# Patient Record
Sex: Male | Born: 1990 | Race: Black or African American | Hispanic: No | Marital: Single | Smoking: Current some day smoker
Health system: Southern US, Community
[De-identification: ages and names within clinical notes are randomized; demographics above are authoritative.]

## PROBLEM LIST (undated history)

## (undated) DIAGNOSIS — Z789 Other specified health status: Secondary | ICD-10-CM

## (undated) HISTORY — PX: NO PAST SURGERIES: SHX2092

---

## 2016-07-01 ENCOUNTER — Emergency Department (HOSPITAL_COMMUNITY): Payer: Medicaid Other

## 2016-07-01 ENCOUNTER — Observation Stay (HOSPITAL_COMMUNITY): Payer: Medicaid Other

## 2016-07-01 ENCOUNTER — Observation Stay (HOSPITAL_COMMUNITY)
Admission: EM | Admit: 2016-07-01 | Discharge: 2016-07-02 | Disposition: A | Discharge status: Home / Self Care | Payer: Medicaid Other | Attending: Surgery | Admitting: Surgery

## 2016-07-01 ENCOUNTER — Encounter (HOSPITAL_COMMUNITY): Payer: Self-pay | Admitting: *Deleted

## 2016-07-01 DIAGNOSIS — Z23 Encounter for immunization: Secondary | ICD-10-CM | POA: Diagnosis not present

## 2016-07-01 DIAGNOSIS — S42022B Displaced fracture of shaft of left clavicle, initial encounter for open fracture: Principal | ICD-10-CM | POA: Insufficient documentation

## 2016-07-01 DIAGNOSIS — S41032A Puncture wound without foreign body of left shoulder, initial encounter: Secondary | ICD-10-CM | POA: Diagnosis present

## 2016-07-01 DIAGNOSIS — S42002B Fracture of unspecified part of left clavicle, initial encounter for open fracture: Secondary | ICD-10-CM | POA: Diagnosis present

## 2016-07-01 DIAGNOSIS — T148XXA Other injury of unspecified body region, initial encounter: Secondary | ICD-10-CM

## 2016-07-01 DIAGNOSIS — Z1810 Retained metal fragments, unspecified: Secondary | ICD-10-CM | POA: Diagnosis not present

## 2016-07-01 DIAGNOSIS — W3400XA Accidental discharge from unspecified firearms or gun, initial encounter: Secondary | ICD-10-CM

## 2016-07-01 DIAGNOSIS — S41009A Unspecified open wound of unspecified shoulder, initial encounter: Secondary | ICD-10-CM | POA: Diagnosis present

## 2016-07-01 HISTORY — DX: Other specified health status: Z78.9

## 2016-07-01 LAB — CBC
HEMATOCRIT: 39 % (ref 39.0–52.0)
Hemoglobin: 13.3 g/dL (ref 13.0–17.0)
MCH: 29.2 pg (ref 26.0–34.0)
MCHC: 34.1 g/dL (ref 30.0–36.0)
MCV: 85.5 fL (ref 78.0–100.0)
Platelets: 207 10*3/uL (ref 150–400)
RBC: 4.56 MIL/uL (ref 4.22–5.81)
RDW: 13.2 % (ref 11.5–15.5)
WBC: 13 10*3/uL — AB (ref 4.0–10.5)

## 2016-07-01 LAB — BASIC METABOLIC PANEL
ANION GAP: 12 (ref 5–15)
BUN: 13 mg/dL (ref 6–20)
CALCIUM: 9.1 mg/dL (ref 8.9–10.3)
CO2: 20 mmol/L — AB (ref 22–32)
Chloride: 107 mmol/L (ref 101–111)
Creatinine, Ser: 1.07 mg/dL (ref 0.61–1.24)
GFR calc Af Amer: >60 mL/min (ref >60)
GFR calc non Af Amer: >60 mL/min (ref >60)
GLUCOSE: 117 mg/dL — AB (ref 65–99)
Potassium: 3.2 mmol/L — ABNORMAL LOW (ref 3.5–5.1)
Sodium: 139 mmol/L (ref 135–145)

## 2016-07-01 LAB — PROTIME-INR
INR: 0.94
Prothrombin Time: 12.5 seconds (ref 11.4–15.2)

## 2016-07-01 MED ORDER — ONDANSETRON HCL 4 MG/2ML IJ SOLN
4.0000 mg | Freq: Once | INTRAMUSCULAR | Status: AC
  Administered 2016-07-01: 4 mg via INTRAVENOUS
  Filled 2016-07-01: qty 2

## 2016-07-01 MED ORDER — DOCUSATE SODIUM 100 MG PO CAPS
100.0000 mg | ORAL_CAPSULE | Freq: Two times a day (BID) | ORAL | Status: DC
  Administered 2016-07-01 – 2016-07-02 (×2): 100 mg via ORAL
  Filled 2016-07-01 (×3): qty 1

## 2016-07-01 MED ORDER — MORPHINE SULFATE (PF) 4 MG/ML IV SOLN
4.0000 mg | Freq: Once | INTRAVENOUS | Status: AC
  Administered 2016-07-01: 4 mg via INTRAVENOUS
  Filled 2016-07-01: qty 1

## 2016-07-01 MED ORDER — KCL IN DEXTROSE-NACL 20-5-0.45 MEQ/L-%-% IV SOLN
INTRAVENOUS | Status: DC
  Administered 2016-07-01: 09:00:00 via INTRAVENOUS
  Administered 2016-07-01: 1 mL via INTRAVENOUS
  Filled 2016-07-01 (×2): qty 1000

## 2016-07-01 MED ORDER — IOPAMIDOL (ISOVUE-370) INJECTION 76%
100.0000 mL | Freq: Once | INTRAVENOUS | Status: AC | PRN
  Administered 2016-07-01: 100 mL via INTRAVENOUS

## 2016-07-01 MED ORDER — CEPHALEXIN 500 MG PO CAPS
500.0000 mg | ORAL_CAPSULE | Freq: Four times a day (QID) | ORAL | Status: DC
  Administered 2016-07-01 – 2016-07-02 (×5): 500 mg via ORAL
  Filled 2016-07-01 (×5): qty 1

## 2016-07-01 MED ORDER — TETANUS-DIPHTH-ACELL PERTUSSIS 5-2.5-18.5 LF-MCG/0.5 IM SUSP
0.5000 mL | Freq: Once | INTRAMUSCULAR | Status: AC
  Administered 2016-07-01: 0.5 mL via INTRAMUSCULAR
  Filled 2016-07-01: qty 0.5

## 2016-07-01 MED ORDER — CEFAZOLIN IN D5W 1 GM/50ML IV SOLN
1.0000 g | Freq: Once | INTRAVENOUS | Status: AC
  Administered 2016-07-01: 1 g via INTRAVENOUS
  Filled 2016-07-01: qty 50

## 2016-07-01 MED ORDER — ENOXAPARIN SODIUM 40 MG/0.4ML ~~LOC~~ SOLN
40.0000 mg | SUBCUTANEOUS | Status: DC
  Administered 2016-07-01: 40 mg via SUBCUTANEOUS
  Filled 2016-07-01 (×2): qty 0.4

## 2016-07-01 MED ORDER — OXYCODONE HCL 5 MG PO TABS
5.0000 mg | ORAL_TABLET | ORAL | Status: DC | PRN
  Administered 2016-07-01 – 2016-07-02 (×4): 10 mg via ORAL
  Filled 2016-07-01 (×4): qty 2

## 2016-07-01 MED ORDER — MORPHINE SULFATE (PF) 2 MG/ML IV SOLN
2.0000 mg | INTRAVENOUS | Status: DC | PRN
  Administered 2016-07-01 (×2): 4 mg via INTRAVENOUS
  Administered 2016-07-02: 2 mg via INTRAVENOUS
  Filled 2016-07-01 (×2): qty 2
  Filled 2016-07-01: qty 1
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
## 2016-07-01 NOTE — ED Notes (Addendum)
DG at bedside. 
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
## 2016-07-01 NOTE — ED Notes (Signed)
Patient transported to CT 
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
## 2016-07-01 NOTE — Progress Notes (Signed)
Orthopedic Tech Progress Note Patient Details:  Arthur AllegraDerek Soto [birth year] [id number]  Ortho Devices Type of Ortho Device: Arm sling Ortho Device/Splint Interventions: Application   Arthur FordyceJennifer C Oliviarose Soto 07/01/2016, 2:16 PM

## 2016-07-02 DIAGNOSIS — S41009A Unspecified open wound of unspecified shoulder, initial encounter: Secondary | ICD-10-CM | POA: Diagnosis present

## 2016-07-02 DIAGNOSIS — S42002B Fracture of unspecified part of left clavicle, initial encounter for open fracture: Secondary | ICD-10-CM | POA: Diagnosis present

## 2016-07-02 MED ORDER — CEPHALEXIN 500 MG PO CAPS: 500.0000 mg | ORAL_CAPSULE | Freq: Four times a day (QID) | ORAL | 0 refills | Status: AC

## 2016-07-02 MED ORDER — OXYCODONE-ACETAMINOPHEN 7.5-325 MG PO TABS: 1.0000 | ORAL_TABLET | ORAL | 0 refills | Status: AC | PRN
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
## 2016-07-02 NOTE — Progress Notes (Signed)
Leotis ShamesJeffery, PA paged to ask for paper RX for patient. Case Manager is going to try and give patient assistance for medications and needs paper RX.

## 2016-07-02 NOTE — Discharge Instructions (Signed)
Wet-to-dry dressings to left shoulder three times daily.  May wash in shower with soap and water; do not soak.  No driving while taking oxycodone.  No lifting, pushing, or pulling with left arm.
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
## 2016-08-14 ENCOUNTER — Ambulatory Visit (INDEPENDENT_AMBULATORY_CARE_PROVIDER_SITE_OTHER): Payer: Medicaid Other | Admitting: Orthopaedic Surgery

## 2016-08-14 DIAGNOSIS — S42025D Nondisplaced fracture of shaft of left clavicle, subsequent encounter for fracture with routine healing: Secondary | ICD-10-CM

## 2016-08-30 ENCOUNTER — Telehealth (INDEPENDENT_AMBULATORY_CARE_PROVIDER_SITE_OTHER): Payer: Self-pay | Admitting: Orthopaedic Surgery

## 2016-08-30 NOTE — Telephone Encounter (Signed)
[facility] on [address]

## 2016-08-30 NOTE — Telephone Encounter (Signed)
[place] [facility] [place] [place] that [place]. Roda ShuttersXu may recommend.  Contact Info: [phone]

## 2016-08-31 NOTE — Telephone Encounter (Signed)
Called pt to advise, states he has called there and the earliest appt to get in is Nov 8th. I called and tried to get him sooner but Joy at [facility] states they cannot see him any sooner. Patient does not have any insurance and they are only trying to help him by getting him financial assistance so that his PT bill wont be to high. Pt has pending Medicaid and in uninsured at this time. Earliest appt for financial assistance is Nov 8th

## 2016-09-25 ENCOUNTER — Ambulatory Visit (INDEPENDENT_AMBULATORY_CARE_PROVIDER_SITE_OTHER): Payer: Medicaid Other

## 2016-09-25 ENCOUNTER — Encounter (INDEPENDENT_AMBULATORY_CARE_PROVIDER_SITE_OTHER): Payer: Self-pay | Admitting: Orthopaedic Surgery

## 2016-09-25 ENCOUNTER — Ambulatory Visit (INDEPENDENT_AMBULATORY_CARE_PROVIDER_SITE_OTHER): Payer: Self-pay | Admitting: Orthopaedic Surgery

## 2016-09-25 DIAGNOSIS — S42025D Nondisplaced fracture of shaft of left clavicle, subsequent encounter for fracture with routine healing: Secondary | ICD-10-CM

## 2016-09-25 MED ORDER — HYDROCODONE-ACETAMINOPHEN 5-325 MG PO TABS: 1.0000 | ORAL_TABLET | Freq: Two times a day (BID) | ORAL | 0 refills | Status: AC | PRN
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
## 2016-11-14 ENCOUNTER — Telehealth (INDEPENDENT_AMBULATORY_CARE_PROVIDER_SITE_OTHER): Payer: Self-pay | Admitting: Orthopaedic Surgery

## 2016-11-14 NOTE — Telephone Encounter (Signed)
Patient called asked if Dr Roda ShuttersXu would write a letter to Social Services stating he is still under doctors care and going to therapy. Patient said he will pick up the letter. The number to contact patient is [phone]

## 2016-11-15 NOTE — Telephone Encounter (Signed)
Letter ready for pick up

## 2016-11-15 NOTE — Telephone Encounter (Signed)
Ok that's fine

## 2016-11-15 NOTE — Telephone Encounter (Signed)
See message below °

## 2016-11-28 ENCOUNTER — Ambulatory Visit (INDEPENDENT_AMBULATORY_CARE_PROVIDER_SITE_OTHER): Payer: Medicaid Other | Admitting: Orthopaedic Surgery

## 2016-11-28 ENCOUNTER — Encounter (INDEPENDENT_AMBULATORY_CARE_PROVIDER_SITE_OTHER): Payer: Self-pay | Admitting: Orthopaedic Surgery

## 2016-11-28 ENCOUNTER — Ambulatory Visit (INDEPENDENT_AMBULATORY_CARE_PROVIDER_SITE_OTHER): Payer: Medicaid Other

## 2016-11-28 DIAGNOSIS — S42002D Fracture of unspecified part of left clavicle, subsequent encounter for fracture with routine healing: Secondary | ICD-10-CM | POA: Diagnosis not present
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
